# Patient Record
Sex: Male | Born: 2000 | Hispanic: Yes | Marital: Single | Smoking: Never smoker
Health system: Southern US, Community
[De-identification: ages and names within clinical notes are randomized; demographics above are authoritative.]

## PROBLEM LIST (undated history)

## (undated) DIAGNOSIS — H60333 Swimmer's ear, bilateral: Secondary | ICD-10-CM

---
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
## 2022-02-28 DIAGNOSIS — J029 Acute pharyngitis, unspecified: Secondary | ICD-10-CM | POA: Diagnosis not present

## 2022-02-28 DIAGNOSIS — J209 Acute bronchitis, unspecified: Secondary | ICD-10-CM | POA: Diagnosis not present

## 2022-02-28 DIAGNOSIS — J069 Acute upper respiratory infection, unspecified: Secondary | ICD-10-CM | POA: Diagnosis not present

## 2022-03-15 IMAGING — DX DG CHEST 2V
2 series · 2 of 2 positions shown · non-contrast
Comparison: None.

CLINICAL DATA: Productive cough and congestion

EXAM:
CHEST - 2 VIEW

[chest pa]
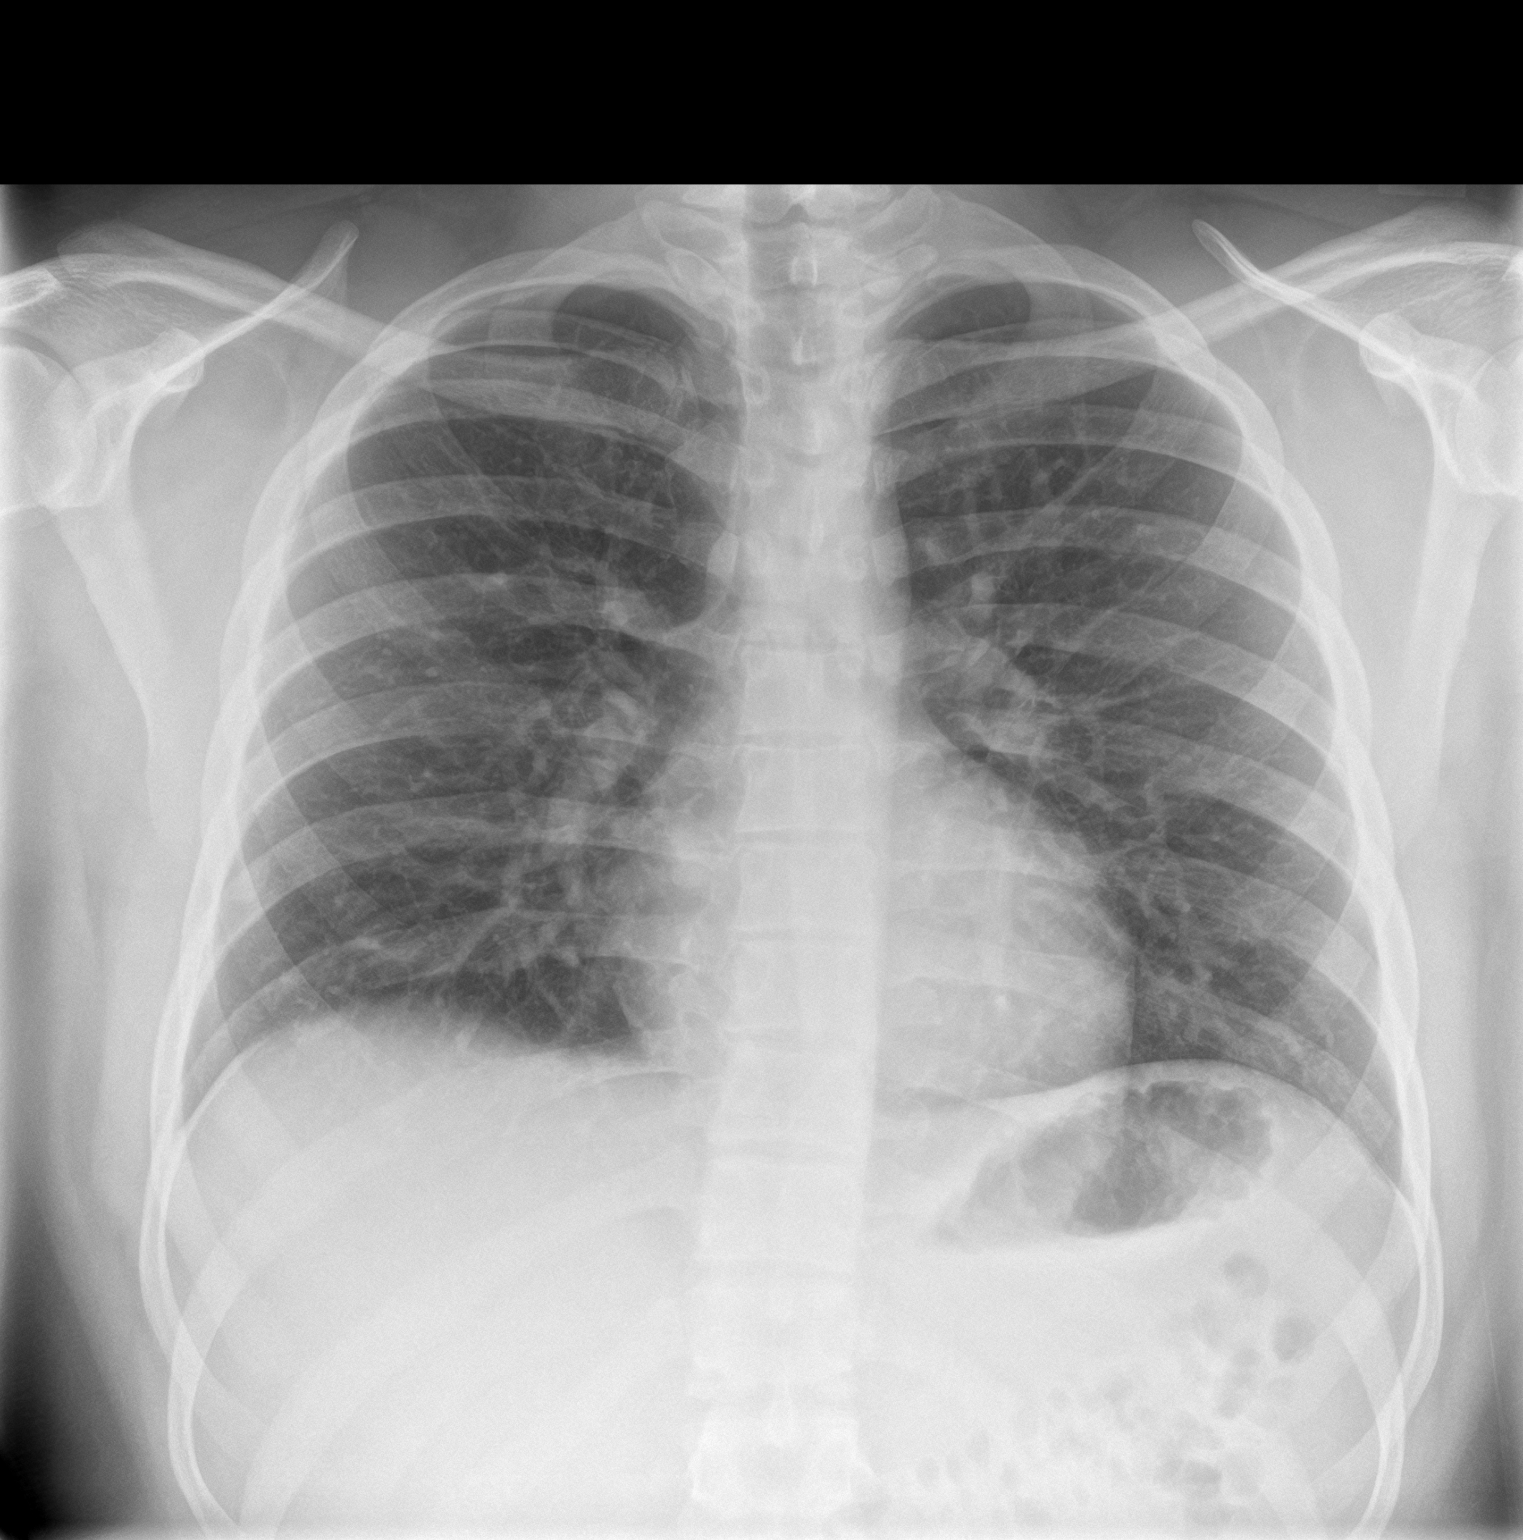

[chest lat]
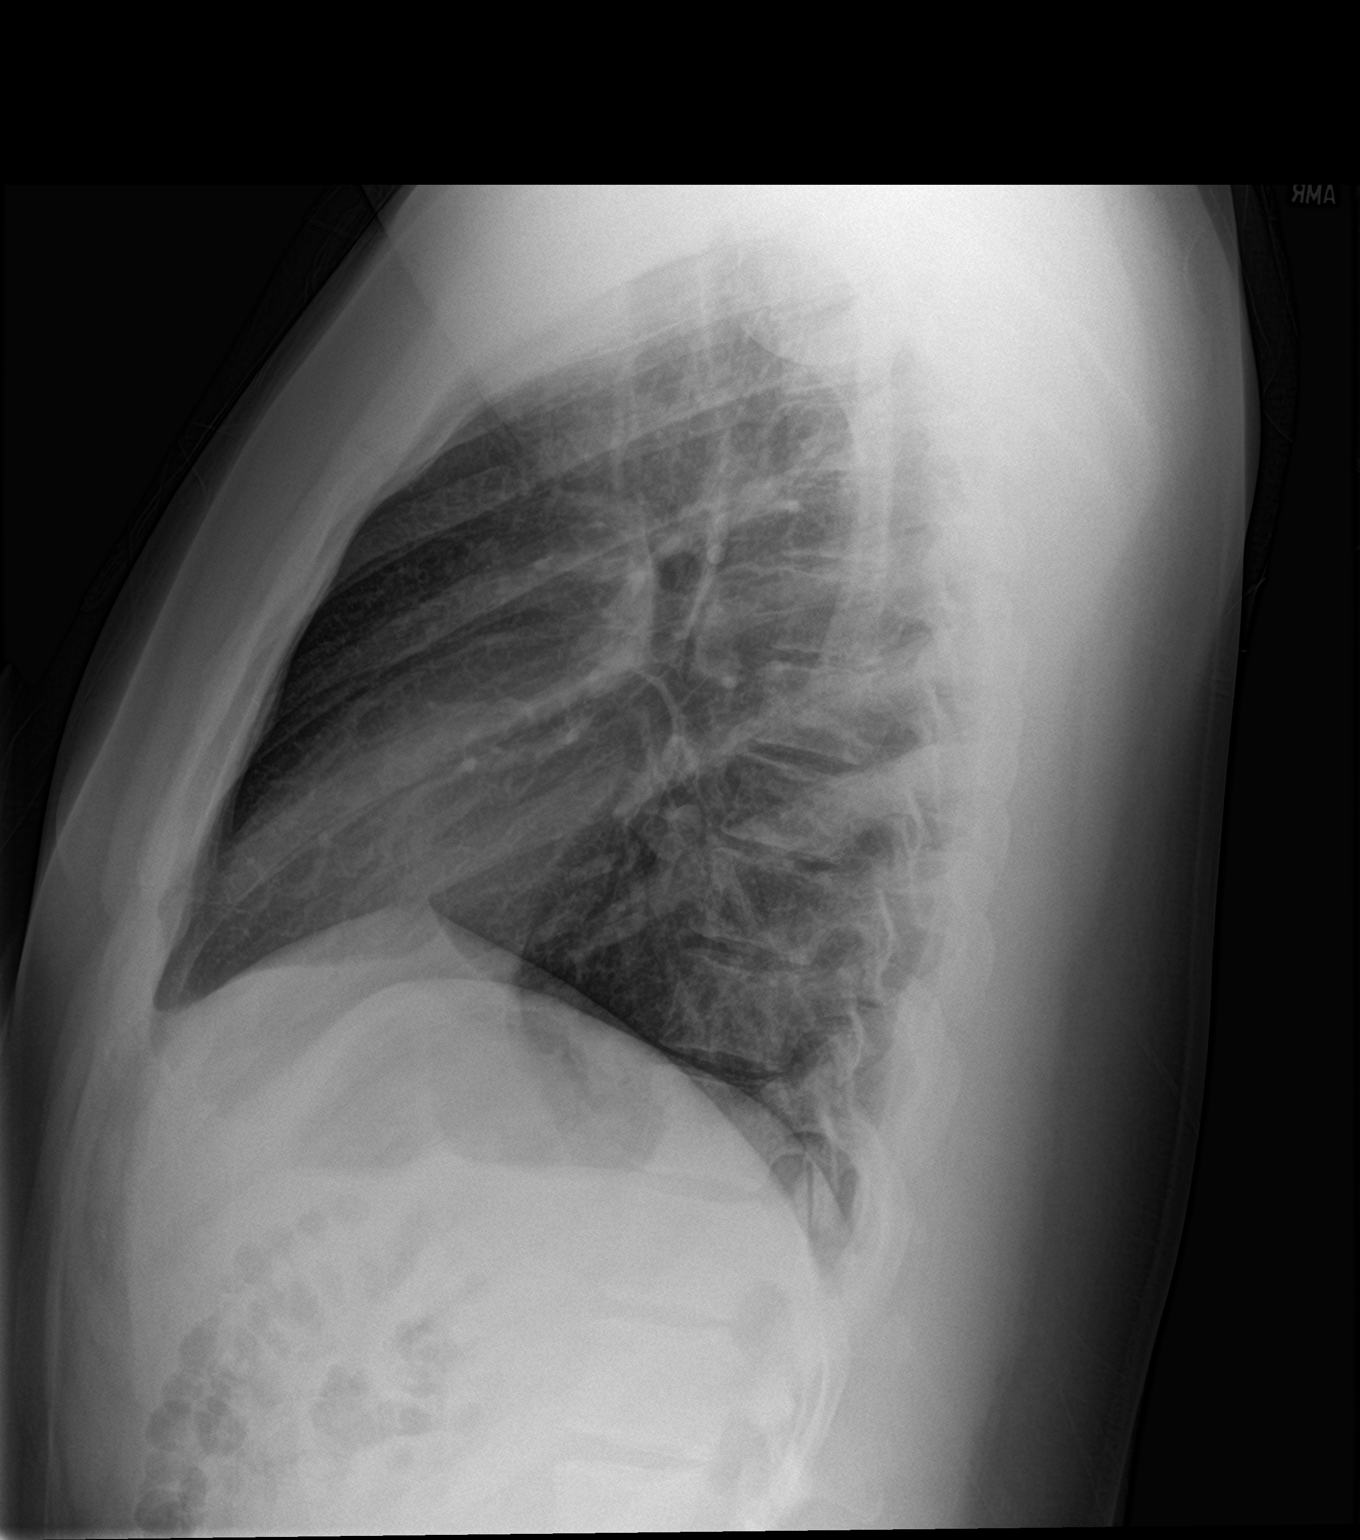

[2 of 2 positions shown; findings below may reference images not displayed]

FINDINGS: The heart size and mediastinal contours are within normal limits.
Both lungs are clear. The visualized skeletal structures are
unremarkable.
IMPRESSION: No active cardiopulmonary disease.
